# Patient Record
Sex: Female | Born: 1955 | Race: Black or African American | Hispanic: No | Marital: Single | Smoking: Never smoker
Health system: Southern US, Community
[De-identification: ages and names within clinical notes are randomized; demographics above are authoritative.]

## PROBLEM LIST (undated history)

## (undated) DIAGNOSIS — I10 Essential (primary) hypertension: Secondary | ICD-10-CM

---

## 2009-02-15 ENCOUNTER — Emergency Department (HOSPITAL_COMMUNITY): Admission: EM | Admit: 2009-02-15 | Discharge: 2009-02-15 | Payer: Self-pay | Admitting: Emergency Medicine

## 2019-03-28 ENCOUNTER — Encounter (HOSPITAL_COMMUNITY): Payer: Self-pay | Admitting: Emergency Medicine

## 2019-03-28 ENCOUNTER — Ambulatory Visit (HOSPITAL_COMMUNITY)
Admission: EM | Admit: 2019-03-28 | Discharge: 2019-03-28 | Disposition: A | Discharge status: Home / Self Care | Payer: PRIVATE HEALTH INSURANCE | Attending: Family Medicine | Admitting: Family Medicine

## 2019-03-28 DIAGNOSIS — T63441A Toxic effect of venom of bees, accidental (unintentional), initial encounter: Secondary | ICD-10-CM

## 2019-03-28 DIAGNOSIS — M79672 Pain in left foot: Secondary | ICD-10-CM | POA: Diagnosis not present

## 2019-03-28 HISTORY — DX: Essential (primary) hypertension: I10

## 2019-03-28 MED ORDER — TRAMADOL HCL 50 MG PO TABS: 50.0000 mg | ORAL_TABLET | Freq: Every evening | ORAL | 0 refills | Status: DC | PRN

## 2019-03-28 NOTE — ED Provider Notes (Signed)
[facility]    CSN: [id number] Arrival date & time: 03/28/19  1220     History   Chief Complaint Chief Complaint  Patient presents with  . Appointment    1230`  . Insect Bite    HPI Andrea Suarez is a 63 y.o. female.   63 year old female comes in for bee sting of the left ventral aspect of foot occurring last night.  States she was walking up and down the stairs with the door open, and stepped on the knee.  She has pain to the distal first and second MTP the left foot since.  Has throbbing, with painful range of motion and weightbearing.  Denies swelling, erythema, warmth.  She has been taking ibuprofen without much relief.  States having trouble sleeping at night due to the pain.  Denies itching, shortness of breath, swelling of the throat, tripoding, drooling, trismus.     Past Medical History:  Diagnosis Date  . Hypertension     There are no active problems to display for this patient.   History reviewed. No pertinent surgical history.  OB History   No obstetric history on file.      Home Medications    Prior to Admission medications   Medication Sig Start Date End Date Taking? Authorizing Provider  traMADol (ULTRAM) 50 MG tablet Take 1 tablet (50 mg total) by mouth at bedtime as needed. 03/28/19   Belinda Fisher, PA-C    Family History Family History  Family history unknown: Yes    Social History Social History   Tobacco Use  . Smoking status: Never Smoker  Substance Use Topics  . Alcohol use: Yes  . Drug use: Not on file     Allergies   Patient has no known allergies.   Review of Systems Review of Systems  Reason unable to perform ROS: See HPI as above.     Physical Exam Triage Vital Signs ED Triage Vitals [03/28/19 1241]  Enc Vitals Group     BP (!) 204/105     Pulse Rate 71     Resp 18     Temp 98.5 F (36.9 C)     Temp src      SpO2 96 %     Weight      Height      Head Circumference      Peak Flow      Pain Score  8     Pain Loc      Pain Edu?      Excl. in GC?    No data found.  Updated Vital Signs BP (!) 176/92   Pulse 71   Temp 98.5 F (36.9 C)   Resp 18   SpO2 96%   Physical Exam Constitutional:      General: She is not in acute distress.    Appearance: She is well-developed. She is not diaphoretic.  HENT:     Head: Normocephalic and atraumatic.  Eyes:     Conjunctiva/sclera: Conjunctivae normal.     Pupils: Pupils are equal, round, and reactive to light.  Musculoskeletal:     Comments: No swelling, erythema, warmth, contusion.  No obvious puncture wound.  Tenderness to palpation of distal left first and second MTP.  No tenderness to palpation of toes.  Full range of motion of toes and ankles.  Sensation intact and equal bilaterally.  Pedal pulse 2+, cap refill less than 2 seconds.  Neurological:     Mental Status: She  is alert and oriented to person, place, and time.     UC Treatments / Results  Labs (all labs ordered are listed, but only abnormal results are displayed) Labs Reviewed - No data to display  EKG None  Radiology No results found.  Procedures Procedures (including critical care time)  Medications Ordered in UC Medications - No data to display  Initial Impression / Assessment and Plan / UC Course  I have reviewed the triage vital signs and the nursing notes.  Pertinent labs & imaging results that were available during my care of the patient were reviewed by me and considered in my medical decision making (see chart for details).    No alarming signs on exam. Will have patient continue NSAIDs. Tramadol D#5 provided at night as needed to help with pain. Ice compress, rest. Return precautions given. Patient expresses understanding and agrees to plan.  Final Clinical Impressions(s) / UC Diagnoses   Final diagnoses:  Bee sting, accidental or unintentional, initial encounter    ED Prescriptions    Medication Sig Dispense Auth. Provider   traMADol  (ULTRAM) 50 MG tablet Take 1 tablet (50 mg total) by mouth at bedtime as needed. 5 tablet Belinda FisherYu, Keymiah Lyles V, PA-C     Controlled Substance Prescriptions [place] Controlled Substance Registry consulted? Yes, I have consulted the [place] Controlled Substances Registry for this patient, and feel the risk/benefit ratio today is favorable for proceeding with this prescription for a controlled substance.   Belinda FisherYu, Sya Nestler V, PA-C 03/28/19 1324

## 2019-03-28 NOTE — Discharge Instructions (Signed)
No alarming signs on exam. No infection seen at this time. Ibuprofen 600-800mg  three times a day for pain and inflammation. Tramadol as needed at night for further pain relief. Ice compress to the foot, with elevation. Monitor for spreading redness, increased warmth, fever, follow up for reevaluation needed.

## 2019-03-28 NOTE — ED Triage Notes (Signed)
Pt states last night she was stung by a wasp on her L foot.

## 2019-11-20 ENCOUNTER — Emergency Department (HOSPITAL_COMMUNITY)
Admission: EM | Admit: 2019-11-20 | Discharge: 2019-11-20 | Disposition: A | Discharge status: Home / Self Care | Payer: PRIVATE HEALTH INSURANCE | Attending: Emergency Medicine | Admitting: Emergency Medicine

## 2019-11-20 ENCOUNTER — Other Ambulatory Visit: Payer: Self-pay

## 2019-11-20 ENCOUNTER — Ambulatory Visit (HOSPITAL_COMMUNITY)
Admission: EM | Admit: 2019-11-20 | Discharge: 2019-11-20 | Disposition: A | Discharge status: Other Acute Inpatient Hospital | Payer: PRIVATE HEALTH INSURANCE | Source: Home / Self Care

## 2019-11-20 ENCOUNTER — Encounter (HOSPITAL_COMMUNITY): Payer: Self-pay | Admitting: Emergency Medicine

## 2019-11-20 ENCOUNTER — Encounter (HOSPITAL_COMMUNITY): Payer: Self-pay

## 2019-11-20 ENCOUNTER — Ambulatory Visit (HOSPITAL_COMMUNITY)
Admission: EM | Admit: 2019-11-20 | Discharge: 2019-11-20 | Disposition: A | Discharge status: Home / Self Care | Payer: PRIVATE HEALTH INSURANCE | Attending: Emergency Medicine | Admitting: Emergency Medicine

## 2019-11-20 DIAGNOSIS — Z23 Encounter for immunization: Secondary | ICD-10-CM

## 2019-11-20 DIAGNOSIS — Y9289 Other specified places as the place of occurrence of the external cause: Secondary | ICD-10-CM | POA: Insufficient documentation

## 2019-11-20 DIAGNOSIS — S61315A Laceration without foreign body of left ring finger with damage to nail, initial encounter: Secondary | ICD-10-CM

## 2019-11-20 DIAGNOSIS — Z5189 Encounter for other specified aftercare: Secondary | ICD-10-CM

## 2019-11-20 DIAGNOSIS — W260XXA Contact with knife, initial encounter: Secondary | ICD-10-CM

## 2019-11-20 DIAGNOSIS — I10 Essential (primary) hypertension: Secondary | ICD-10-CM | POA: Insufficient documentation

## 2019-11-20 DIAGNOSIS — Y93G1 Activity, food preparation and clean up: Secondary | ICD-10-CM | POA: Insufficient documentation

## 2019-11-20 DIAGNOSIS — Y999 Unspecified external cause status: Secondary | ICD-10-CM | POA: Insufficient documentation

## 2019-11-20 DIAGNOSIS — S61215A Laceration without foreign body of left ring finger without damage to nail, initial encounter: Secondary | ICD-10-CM | POA: Insufficient documentation

## 2019-11-20 MED ORDER — TETANUS-DIPHTH-ACELL PERTUSSIS 5-2.5-18.5 LF-MCG/0.5 IM SUSP
0.5000 mL | Freq: Once | INTRAMUSCULAR | Status: AC
  Administered 2019-11-20: 13:00:00 0.5 mL via INTRAMUSCULAR

## 2019-11-20 MED ORDER — TETANUS-DIPHTH-ACELL PERTUSSIS 5-2.5-18.5 LF-MCG/0.5 IM SUSP
INTRAMUSCULAR | Status: AC
  Filled 2019-11-20: qty 0.5

## 2019-11-20 MED ORDER — LIDOCAINE-EPINEPHRINE-TETRACAINE (LET) TOPICAL GEL
TOPICAL | Status: AC
  Filled 2019-11-20: qty 3

## 2019-11-20 MED ORDER — TRAMADOL HCL 50 MG PO TABS: 50.0000 mg | ORAL_TABLET | Freq: Four times a day (QID) | ORAL | 0 refills | Status: DC | PRN

## 2019-11-20 MED ORDER — CEPHALEXIN 500 MG PO CAPS: 500.0000 mg | ORAL_CAPSULE | Freq: Four times a day (QID) | ORAL | 0 refills | Status: AC

## 2019-11-20 MED ORDER — CEPHALEXIN 500 MG PO CAPS: 500.0000 mg | ORAL_CAPSULE | Freq: Four times a day (QID) | ORAL | 0 refills | Status: DC

## 2019-11-20 NOTE — ED Provider Notes (Signed)
[facility] Provider Note   CSN: [id number] Arrival date & time: 11/20/19  1753     History Chief Complaint  Patient presents with  . Extremity Laceration    Andrea Suarez is a 63 y.o. female with past medical history of hypertension presenting to emergency department today with chief complaint of finger laceration.  Patient states 2 days ago she was chopping sweet potatoes and accidentally sliced the pad of her left ring finger with a chef's knife.  Since then she has had persistent bleeding despite applying pressure.  She went to urgent care earlier today because the bleeding had not improved.  A procedure note shows patient had digital block, let was applied to the laceration and Dermabond was applied.  She states when she went home she was changing a trash bag and thinks she might of hit her finger on the edge of the trashcan causing it to bleed again.  She again tried to apply pressure without being able to stop bleeding.  She denies any associated pain, numbness or weakness, no decreased sensation..  She denies feeling lightheaded or dizzy.  She is not anticoagulated.  Tetanus was updated at urgent care and she was prescribed Keflex.    Past Medical History:  Diagnosis Date  . Hypertension     There are no problems to display for this patient.   History reviewed. No pertinent surgical history.   OB History   No obstetric history on file.     Family History  Family history unknown: Yes    Social History   Tobacco Use  . Smoking status: Never Smoker  Substance Use Topics  . Alcohol use: Yes  . Drug use: Not on file    Home Medications Prior to Admission medications   Medication Sig Start Date End Date Taking? Authorizing Provider  cephALEXin (KEFLEX) 500 MG capsule Take 1 capsule (500 mg total) by mouth 4 (four) times daily for 5 days. 11/20/19 11/25/19  Wieters, Hallie C, PA-C  traMADol (ULTRAM) 50 MG tablet Take 1 tablet  (50 mg total) by mouth every 6 (six) hours as needed. 11/20/19   Wieters, Hallie C, PA-C    Allergies    Patient has no known allergies.  Review of Systems   Review of Systems  All other systems are reviewed and are negative for acute change except as noted in the HPI.   Physical Exam Updated Vital Signs BP 117/83 (BP Location: Right Arm)   Pulse 94   Temp 98.7 F (37.1 C) (Oral)   Resp 16   SpO2 98%   Physical Exam Vitals and nursing note reviewed.  Constitutional:      Appearance: She is well-developed. She is not ill-appearing or toxic-appearing.  HENT:     Head: Normocephalic and atraumatic.     Nose: Nose normal.  Eyes:     General: No scleral icterus.       Right eye: No discharge.        Left eye: No discharge.     Conjunctiva/sclera: Conjunctivae normal.  Neck:     Vascular: No JVD.  Cardiovascular:     Rate and Rhythm: Normal rate and regular rhythm.     Pulses: Normal pulses.     Heart sounds: Normal heart sounds.  Pulmonary:     Effort: Pulmonary effort is normal.     Breath sounds: Normal breath sounds.  Abdominal:     General: There is no distension.  Musculoskeletal:  General: Normal range of motion.     Cervical back: Normal range of motion.     Comments: Left ring finger fingertip avulsion extending towards nail bed that has been repaired with Dermabond.  There is an extensive amount of Dermabond over tip of left finger.  There is small amount of blood oozing from under the dermabond. Radial pulse 2+ .  Full range of motion of left index finger.  Nailbed is intact. Able to wiggle all fingers.  Skin:    General: Skin is warm and dry.  Neurological:     Mental Status: She is oriented to person, place, and time.     GCS: GCS eye subscore is 4. GCS verbal subscore is 5. GCS motor subscore is 6.     Comments: Fluent speech, no facial droop.  Psychiatric:        Behavior: Behavior normal.       ED Results / Procedures / Treatments    Labs (all labs ordered are listed, but only abnormal results are displayed) Labs Reviewed - No data to display  EKG None  Radiology No results found.  Procedures Procedures (including critical care time)  Medications Ordered in ED Medications - No data to display  ED Course  I have reviewed the triage vital signs and the nursing notes.  Pertinent labs & imaging results that were available during my care of the patient were reviewed by me and considered in my medical decision making (see chart for details).    MDM Rules/Calculators/A&P                      Patient seen and examined.  She is well-appearing, no acute distress. VSS.  Left index finger is coated and Dermabond however there is still a small amount of blood oozing from underneath it.  Range of motion is intact of left finger and she is denying any associated pain.  Radial pulses 2+.  Applied wound seal powder and bleeding stopped. Applied pressure dressing and aluminum finger splint for protection.   The patient appears reasonably screened and/or stabilized for discharge and I doubt any other medical condition or other [facility] requiring further screening, evaluation, or treatment in the ED at this time prior to discharge. The patient is safe for discharge with strict return precautions discussed. Recommend pcp follow up for wound check in 2 days. Patient will continue antibiotic as prescribed by urgent care.  She agrees with plan of care.  Portions of this note were generated with Scientist, clinical (histocompatibility and immunogenetics). Dictation errors may occur despite best attempts at proofreading.    Final Clinical Impression(s) / ED Diagnoses Final diagnoses:  Visit for wound check    Rx / DC Orders ED Discharge Orders    None       Sherene Sires, PA-C 11/20/19 1946    Virgina Norfolk, DO 11/21/19 0022

## 2019-11-20 NOTE — Discharge Instructions (Addendum)
Keep area clean and dry, avoid further injury/trauma to this area temporarily Begin keflex 4 times daily for 5 days Tetanus updated  WOUND CARE  Keep area clean and dry for 24 hours. Do not remove bandage, if applied.  After 24 hours, remove bandage and wash wound gently with mild soap and warm water. Reapply a new bandage after cleaning wound, if directed.  Continue daily cleansing with soap and water until stitches/staples are removed.  Do not apply any ointments or creams to the wound while dermabond  in place, as this may cause delayed healing.  Notify the office if you experience any of the following signs of infection: Swelling, redness, pus drainage, streaking, fever >101.0 F  Notify the office if you experience excessive bleeding that does not stop after 15-20 minutes of constant, firm pressure.

## 2019-11-20 NOTE — ED Notes (Signed)
Patient verbalizes understanding of discharge instructions. Opportunity for questioning and answers were provided. Armband removed by staff, pt discharged from ED.  

## 2019-11-20 NOTE — ED Provider Notes (Signed)
[facility]    CSN: [id number] Arrival date & time: 11/20/19  1147      History   Chief Complaint Chief Complaint  Patient presents with  . Laceration    HPI Andrea Suarez is a 63 y.o. female history of hypertension presenting today for evaluation of finger laceration.  Patient states 2 days ago she accidentally cut the tip of her left ring finger with a chef's knife.  She was chopping sweet potatoes.  Denies using a mandolin.  The tip of her finger was sliced off.  Since she has had persistent bleeding.  Bleeding started to improve yesterday, but this morning she noticed it was still bleeding 1.5 days later.  She is unsure of her last tetanus.  Denies difficulty bending or moving finger.  Main concern is bleeding.  Denies use of anticoagulants.  HPI  Past Medical History:  Diagnosis Date  . Hypertension     There are no problems to display for this patient.   History reviewed. No pertinent surgical history.  OB History   No obstetric history on file.      Home Medications    Prior to Admission medications   Medication Sig Start Date End Date Taking? Authorizing Provider  cephALEXin (KEFLEX) 500 MG capsule Take 1 capsule (500 mg total) by mouth 4 (four) times daily for 5 days. 11/20/19 11/25/19  Fuquan Wilson C, PA-C  traMADol (ULTRAM) 50 MG tablet Take 1 tablet (50 mg total) by mouth every 6 (six) hours as needed. 11/20/19   Taquilla Downum, Junius Creamer, PA-C    Family History Family History  Family history unknown: Yes    Social History Social History   Tobacco Use  . Smoking status: Never Smoker  Substance Use Topics  . Alcohol use: Yes  . Drug use: Not on file     Allergies   Patient has no known allergies.   Review of Systems Review of Systems  Constitutional: Negative for fatigue and fever.  Eyes: Negative for visual disturbance.  Respiratory: Negative for shortness of breath.   Cardiovascular: Negative for chest pain.  Gastrointestinal:  Negative for abdominal pain, nausea and vomiting.  Musculoskeletal: Negative for arthralgias and joint swelling.  Skin: Positive for color change and wound. Negative for rash.  Neurological: Negative for dizziness, weakness, light-headedness and headaches.     Physical Exam Triage Vital Signs ED Triage Vitals  Enc Vitals Group     BP 11/20/19 1339 (!) 151/87     Pulse Rate 11/20/19 1339 70     Resp 11/20/19 1339 18     Temp 11/20/19 1339 98.5 F (36.9 C)     Temp Source 11/20/19 1339 Oral     SpO2 11/20/19 1339 94 %     Weight --      Height --      Head Circumference --      Peak Flow --      Pain Score 11/20/19 1206 8     Pain Loc --      Pain Edu? --      Excl. in GC? --    No data found.  Updated Vital Signs BP (!) 151/87 (BP Location: Left Arm)   Pulse 70   Temp 98.5 F (36.9 C) (Oral)   Resp 18   SpO2 94%   Visual Acuity Right Eye Distance:   Left Eye Distance:   Bilateral Distance:    Right Eye Near:   Left Eye Near:    Bilateral  Near:     Physical Exam Vitals and nursing note reviewed.  Constitutional:      Appearance: She is well-developed.     Comments: No acute distress  HENT:     Head: Normocephalic and atraumatic.     Nose: Nose normal.  Eyes:     Conjunctiva/sclera: Conjunctivae normal.  Cardiovascular:     Rate and Rhythm: Normal rate.  Pulmonary:     Effort: Pulmonary effort is normal. No respiratory distress.  Abdominal:     General: There is no distension.  Musculoskeletal:        General: Normal range of motion.     Cervical back: Neck supple.     Comments: Left ring finger: Fingertip avulsion extending towards nail bed, bleeding profusely; full active range of motion of the finger, radial pulse 2+  Skin:    General: Skin is warm and dry.  Neurological:     Mental Status: She is alert and oriented to person, place, and time.      UC Treatments / Results  Labs (all labs ordered are listed, but only abnormal results are  displayed) Labs Reviewed - No data to display  EKG   Radiology No results found.  Procedures Laceration Repair  Date/Time: 11/20/2019 2:09 PM Performed by: Elleah Hemsley, [place] C, PA-C Authorized by: Marry Kusch, Elesa Hacker, PA-C   Consent:    Consent obtained:  Verbal   Consent given by:  Patient   Risks discussed:  Pain, infection, poor cosmetic result and poor wound healing   Alternatives discussed:  Observation Anesthesia (see MAR for exact dosages):    Anesthesia method:  Nerve block and topical application   Topical anesthetic:  LET   Block location:  Base of left ring finger   Block needle gauge:  25 G   Block anesthetic:  Lidocaine 2% w/o epi   Block technique:  2 seperate injection sites   Block injection procedure:  Introduced needle and anatomic landmarks identified   Block outcome:  Anesthesia achieved Laceration details:    Location:  Finger   Finger location:  L ring finger   Length (cm):  1 Repair type:    Repair type:  Simple Pre-procedure details:    Preparation:  Patient was prepped and draped in usual sterile fashion Exploration:    Hemostasis achieved with:  LET, tourniquet and direct pressure   Wound exploration: wound explored through full range of motion     Wound extent: no foreign bodies/material noted and no underlying fracture noted   Skin repair:    Repair method:  Tissue adhesive Post-procedure details:    Dressing:  Non-adherent dressing   Patient tolerance of procedure:  Tolerated well, no immediate complications   (including critical care time)  Medications Ordered in UC Medications  Tdap (BOOSTRIX) injection 0.5 mL (0.5 mLs Intramuscular Given 11/20/19 1301)    Initial Impression / Assessment and Plan / UC Course  I have reviewed the triage vital signs and the nursing notes.  Pertinent labs & imaging results that were available during my care of the patient were reviewed by me and considered in my medical decision making (see chart for  details).     Hemostasis achieved after prolonged pressure, elevation.  Initially continuing to bleed, digital block initiated in order to perform cautery, but after digital block bleeding subsided.  Opted for application of Dermabond instead.  Wound not cleansed and irrigated as typically would due to initial concern to stop profuse bleeding, after hemostasis achieved deferred  further cleansing due to concern of reinitiating bleeding.  Placing on Keflex empirically because of this to take for the next 5 days.  Advised to monitor for any signs of infection and follow-up immediately if developing any redness, increased pain or drainage.  Discussed strict return precautions. Patient verbalized understanding and is agreeable with plan.  Final Clinical Impressions(s) / UC Diagnoses   Final diagnoses:  Laceration of left ring finger without foreign body with damage to nail, initial encounter     Discharge Instructions     Keep area clean and dry, avoid further injury/trauma to this area temporarily Begin keflex 4 times daily for 5 days Tetanus updated  WOUND CARE . Keep area clean and dry for 24 hours. Do not remove bandage, if applied. . After 24 hours, remove bandage and wash wound gently with mild soap and warm water. Reapply a new bandage after cleaning wound, if directed. . Continue daily cleansing with soap and water until stitches/staples are removed. . Do not apply any ointments or creams to the wound while dermabond  in place, as this may cause delayed healing. . Notify the office if you experience any of the following signs of infection: Swelling, redness, pus drainage, streaking, fever >101.0 F . Notify the office if you experience excessive bleeding that does not stop after 15-20 minutes of constant, firm pressure.   ED Prescriptions    Medication Sig Dispense Auth. Provider   cephALEXin (KEFLEX) 500 MG capsule  (Status: Discontinued) Take 1 capsule (500 mg total) by mouth 4  (four) times daily for 5 days. 20 capsule Emiline Mancebo C, PA-C   traMADol (ULTRAM) 50 MG tablet Take 1 tablet (50 mg total) by mouth every 6 (six) hours as needed. 10 tablet Yehudis Monceaux C, PA-C   cephALEXin (KEFLEX) 500 MG capsule Take 1 capsule (500 mg total) by mouth 4 (four) times daily for 5 days. 20 capsule Caitlynne Harbeck, [place] C, PA-C     I have reviewed the PDMP during this encounter.   Lew DawesWieters, Rickesha Veracruz C, PA-C 11/20/19 1414

## 2019-11-20 NOTE — Discharge Instructions (Addendum)
Leave dressing on for the next 24 hours.  The splint has been provided to protect your finger from hitting anything and causing it to bleed again.  Continue the antibiotic as prescribed by urgent care.  Recommend you follow-up in 2 days to have wound check as we discussed.   Return to the emergency department for any new or worsening symptoms including unable to control bleeding, weakness, dizziness, surrounding redness or streaking to suggest infection   ----Please follow up with primary care provider by scheduling an appointment as soon as possible for a visit  If you do not have a primary care physician, contact [facility] at [phone] for referral   ?

## 2019-11-20 NOTE — ED Notes (Signed)
Patient is being discharged from the Urgent [facility] and sent to the Emergency Department via personal vehicle by self. Per provider Debara Pickett, patient is stable but in need of higher level of care due to unable to stop bleeding . Patient is aware and verbalizes understanding of plan of care. There were no vitals filed for this visit.

## 2019-11-20 NOTE — ED Triage Notes (Signed)
Pt states she was seen at [facility] this morning for L 4th digit laceration and had Dermabond placed.  States area has opened back up.

## 2019-11-20 NOTE — ED Triage Notes (Signed)
Pt presents with chunk of tip of left ring finger slightly cut off after cutting up some vegetables Friday evening; pt still has significant amount of bleeding and pain to area.

## 2019-11-24 ENCOUNTER — Emergency Department (HOSPITAL_COMMUNITY): Payer: PRIVATE HEALTH INSURANCE

## 2019-11-24 ENCOUNTER — Emergency Department (HOSPITAL_COMMUNITY)
Admission: EM | Admit: 2019-11-24 | Discharge: 2019-11-24 | Disposition: A | Discharge status: Home / Self Care | Payer: PRIVATE HEALTH INSURANCE | Attending: Emergency Medicine | Admitting: Emergency Medicine

## 2019-11-24 ENCOUNTER — Encounter (HOSPITAL_COMMUNITY): Payer: Self-pay | Admitting: Emergency Medicine

## 2019-11-24 ENCOUNTER — Other Ambulatory Visit: Payer: Self-pay

## 2019-11-24 DIAGNOSIS — R52 Pain, unspecified: Secondary | ICD-10-CM

## 2019-11-24 DIAGNOSIS — I10 Essential (primary) hypertension: Secondary | ICD-10-CM | POA: Insufficient documentation

## 2019-11-24 DIAGNOSIS — S61215D Laceration without foreign body of left ring finger without damage to nail, subsequent encounter: Secondary | ICD-10-CM | POA: Insufficient documentation

## 2019-11-24 DIAGNOSIS — W260XXD Contact with knife, subsequent encounter: Secondary | ICD-10-CM | POA: Insufficient documentation

## 2019-11-24 DIAGNOSIS — Z5189 Encounter for other specified aftercare: Secondary | ICD-10-CM | POA: Insufficient documentation

## 2019-11-24 MED ORDER — LIDOCAINE HCL (PF) 1 % IJ SOLN
INTRAMUSCULAR | Status: AC
  Filled 2019-11-24: qty 10

## 2019-11-24 MED ORDER — TRAMADOL HCL 50 MG PO TABS: 50.0000 mg | ORAL_TABLET | Freq: Four times a day (QID) | ORAL | 0 refills | Status: AC | PRN

## 2019-11-24 NOTE — Discharge Instructions (Addendum)
Please abide by wound care directions, as described by Dr. Mardelle Matte.   Return to the ED for any other new or worsening symptoms, otherwise plan to see Dr. Mardelle Matte outpatient for ongoing evaluation and management.   Please be wary that the tramadol can make you drowsy. Do not combine with alcohol or operate any heavy machinery.

## 2019-11-24 NOTE — ED Triage Notes (Signed)
Pt. Stated, I was here on Sunday for a cut on my finger on Friday. It looks red and infected.  Finger is red and swollen. Warm to touch.

## 2019-11-24 NOTE — Consult Note (Signed)
Reason for Consult:Left ring finger pain Referring Physician: [name] is an 63 y.o. female.  HPI: Andrea Suarez comes in with continued pain and swelling in her left ring finger. She cut the tip of it off on Christmas. She had a hard time getting the bleeding to stop and kept a rubber band around it tight for Friday and Saturday. It seemed like it was improving but continued to bleed on Sunday and she went to UC. They treated with dermabond but she was still bleeding that evening and came to ED. They were finally able to get it to stop and also started abx. On Monday or Tuesday the middle of the finger began to swell and hurt and she returns today for evaluation. She is RHD.  Past Medical History:  Diagnosis Date  . Hypertension     History reviewed. No pertinent surgical history.  Family History  Family history unknown: Yes    Social History:  reports that she has never smoked. She has never used smokeless tobacco. She reports current alcohol use. No history on file for drug.  Allergies: No Known Allergies  Medications: I have reviewed the patient's current medications.  No results found for this or any previous visit (from the past 48 hour(s)).  DG Finger Ring Left  Result Date: 11/24/2019 CLINICAL DATA:  Wound infection for 4 days. EXAM: LEFT RING FINGER 2+V COMPARISON:  None. FINDINGS: Soft tissue swelling centered about the middle phalanx of the fourth digit. No radiopaque foreign object. No osseous destruction. No acute fracture or dislocation. There is more mild soft tissue swelling about the dorsal proximal interphalangeal joint. IMPRESSION: Soft tissue swelling, without acute osseous abnormality. Electronically Signed   By: Abigail Miyamoto M.D.   On: 11/24/2019 16:08    Review of Systems  Constitutional: Negative for chills, diaphoresis and fever.  HENT: Negative for ear discharge, ear pain, hearing loss and tinnitus.   Eyes: Negative for photophobia and pain.   Respiratory: Negative for cough and shortness of breath.   Cardiovascular: Negative for chest pain.  Gastrointestinal: Negative for abdominal pain, nausea and vomiting.  Genitourinary: Negative for dysuria, flank pain, frequency and urgency.  Musculoskeletal: Positive for arthralgias (Left ring finger). Negative for back pain, myalgias and neck pain.  Neurological: Negative for dizziness and headaches.  Hematological: Does not bruise/bleed easily.  Psychiatric/Behavioral: The patient is not nervous/anxious.    Blood pressure (!) 147/111, pulse 90, temperature 98.6 F (37 C), temperature source Oral, resp. rate 20, SpO2 97 %. Physical Exam  Constitutional: She appears well-developed and well-nourished. No distress.  HENT:  Head: Normocephalic and atraumatic.  Eyes: Conjunctivae are normal. Right eye exhibits no discharge. Left eye exhibits no discharge. No scleral icterus.  Cardiovascular: Normal rate and regular rhythm.  Respiratory: Effort normal. No respiratory distress.  Musculoskeletal:     Cervical back: Normal range of motion.     Comments: Left shoulder, elbow, wrist, digits- Healing tip amputation ring finger, superficial fluid collection P2 ~270 degrees volar, mod TTP, nontender, no instability, inability to flex finger 2/2 swelling  Sens  Ax/R/M/U intact, ring finger paresthetic radial/ulnar  Mot   Ax/ R/ PIN/ M/ AIN/ U intact  Rad 2+  Neurological: She is alert.  Skin: Skin is warm and dry. She is not diaphoretic.  Psychiatric: She has a normal mood and affect. Her behavior is normal.    Assessment/Plan: Left ring finger injury -- Unsure about this case. Superficial fluid collection ? From tourniquet use for  2d. Given degree of pain and inability to flex finger worried about infection/tenosynovitis. Will have Dr. Dion Saucier evaluate when he is out of OR later today.    Freeman Caldron, PA-C Orthopedic Surgery [phone] 11/24/2019, 4:25 PM

## 2019-11-24 NOTE — ED Notes (Signed)
Patient Alert and oriented to baseline. Stable and ambulatory to baseline. Patient verbalized understanding of the discharge instructions.  Patient belongings were taken by the patient.   

## 2019-11-24 NOTE — ED Provider Notes (Signed)
[facility] EMERGENCY DEPARTMENT Provider Note   CSN: [id number] Arrival date & time: 11/24/19  1233     History Chief Complaint  Patient presents with  . Wound Infection    Andrea Suarez is a 63 y.o. female with PMH significant for HTN who presents to the ED for a wound check.  Patient reportedly sliced off the pad of her left ring finger with a chef's knife on 24/26/8341 while slicing sweet potatoes.  She presented to the UC for persistent bleeding on 11/20/2019 and Dermabond was applied and she was provided prescription for Keflex.  She then returned to the ED shortly after returning home due to ongoing "oozing" and wound seal powder was applied and hemostasis was achieved.  She was also provided an aluminum finger splint for protection.  She was instructed to return to her PCP for wound check on 11/22/2019.  Patient reports that she has been taking her antibiotics as prescribed and has 1 day left.  She admits that she has not been elevating her hand to help prevent swelling.   HPI     Past Medical History:  Diagnosis Date  . Hypertension     There are no problems to display for this patient.   History reviewed. No pertinent surgical history.   OB History   No obstetric history on file.     Family History  Family history unknown: Yes    Social History   Tobacco Use  . Smoking status: Never Smoker  . Smokeless tobacco: Never Used  Substance Use Topics  . Alcohol use: Yes  . Drug use: Not on file    Home Medications Prior to Admission medications   Medication Sig Start Date End Date Taking? Authorizing Provider  cephALEXin (KEFLEX) 500 MG capsule Take 1 capsule (500 mg total) by mouth 4 (four) times daily for 5 days. 11/20/19 11/25/19  Wieters, Hallie C, PA-C  traMADol (ULTRAM) 50 MG tablet Take 1 tablet (50 mg total) by mouth every 6 (six) hours as needed. 11/24/19   Marchia Bond, MD    Allergies    Patient has no known  allergies.  Review of Systems   Review of Systems  Constitutional: Negative for chills and fever.  Musculoskeletal: Positive for arthralgias.  Skin: Positive for color change and wound.    Physical Exam Updated Vital Signs BP (!) 147/111 (BP Location: Right Arm)   Pulse 90   Temp 98.6 F (37 C) (Oral)   Resp 20   SpO2 97%   Physical Exam Vitals and nursing note reviewed. Exam conducted with a chaperone present.  Constitutional:      Appearance: Normal appearance. She is not ill-appearing.  HENT:     Head: Normocephalic and atraumatic.  Eyes:     General: No scleral icterus.    Conjunctiva/sclera: Conjunctivae normal.  Cardiovascular:     Rate and Rhythm: Normal rate and regular rhythm.     Pulses: Normal pulses.     Heart sounds: Normal heart sounds.  Pulmonary:     Effort: Pulmonary effort is normal.     Breath sounds: Normal breath sounds.  Musculoskeletal:     Cervical back: Normal range of motion and neck supple.     Comments: Left index finger: Circumferential blister noted immediately distal to PIP joint.  Hemostasis achieved. Dermabond and wound seal powder occludes nail. MCP joint undisturbed. Patient can flex and extend affected digit against resistance. Limited ROM at PIP and DIP unable to flex or extend,  likely due to swelling. Sensation intact throughout. Erythema all the way up to the PIP joint, most notably in blister. Left hand: No extension of infection into dorsum or palmar aspect of hand. Wrist ROM and strength fully intact.   Skin:    General: Skin is dry.  Neurological:     Mental Status: She is alert and oriented to person, place, and time.     GCS: GCS eye subscore is 4. GCS verbal subscore is 5. GCS motor subscore is 6.  Psychiatric:        Mood and Affect: Mood normal.        Behavior: Behavior normal.        Thought Content: Thought content normal.            ED Results / Procedures / Treatments   Labs (all labs ordered are listed,  but only abnormal results are displayed) Labs Reviewed - No data to display  EKG None  Radiology DG Finger Ring Left  Result Date: 11/24/2019 CLINICAL DATA:  Wound infection for 4 days. EXAM: LEFT RING FINGER 2+V COMPARISON:  None. FINDINGS: Soft tissue swelling centered about the middle phalanx of the fourth digit. No radiopaque foreign object. No osseous destruction. No acute fracture or dislocation. There is more mild soft tissue swelling about the dorsal proximal interphalangeal joint. IMPRESSION: Soft tissue swelling, without acute osseous abnormality. Electronically Signed   By: Jeronimo Greaves M.D.   On: 11/24/2019 16:08    Procedures Procedures (including critical care time)  Medications Ordered in ED Medications  lidocaine (PF) (XYLOCAINE) 1 % injection (has no administration in time range)    ED Course  I have reviewed the triage vital signs and the nursing notes.  Pertinent labs & imaging results that were available during my care of the patient were reviewed by me and considered in my medical decision making (see chart for details).    MDM Rules/Calculators/A&P                      Patient appears to have onychomycosis on some of the unaffected fingers.  We will have Earney Hamburg PA-C evaluate patient given her swollen, erythematous affected digit after sustaining laceration 6 days ago.  DG finger ring left demonstrates no acute bony abnormalities or gas patterns concerning for osteomyelitis.   Earney Hamburg, PA-C will have Dr. Dion Saucier, on-call hand specialist, evaluate the patient.  Spoke with Dr. Dion Saucier after he evaluated the patient and he will do a bedside procedure to remove Dermabond as well as debride and clean the affected area.  He also plans to have her follow-up outpatient.   Bedside procedure went well. Dr. Dion Saucier will see her on Monday outpatient. He also called her in some tramadol. Patient safe for discharge at this time.     Final Clinical  Impression(s) / ED Diagnoses Final diagnoses:  Encounter for post-traumatic wound check    Rx / DC Orders ED Discharge Orders         Ordered    traMADol (ULTRAM) 50 MG tablet  Every 6 hours PRN     11/24/19 1833           Lorelee New, PA-C 11/24/19 1844    Gwyneth Sprout, MD 11/25/19 1950

## 2019-12-16 ENCOUNTER — Ambulatory Visit: Payer: Self-pay | Attending: Internal Medicine

## 2019-12-16 DIAGNOSIS — Z23 Encounter for immunization: Secondary | ICD-10-CM | POA: Insufficient documentation

## 2019-12-19 NOTE — Progress Notes (Signed)
   Covid-19 Vaccination Clinic  Name:  Galina Haddox    MRN: [id number] DOB: Dec 02, 1955  12/16/2019  Ms. Elbe was observed post Covid-19 immunization for 15 minutes without incidence. She was provided with Vaccine Information Sheet and instruction to access the V-Safe system.   Ms. Pickler was instructed to call 911 with any severe reactions post vaccine: Marland Kitchen Difficulty breathing  . Swelling of your face and throat  . A fast heartbeat  . A bad rash all over your body  . Dizziness and weakness    Immunizations Administered    Name Date Dose VIS Date Route   Moderna COVID-19 Vaccine 12/16/2019 10:59 AM 0.5 mL 10/25/2019 Intramuscular   Manufacturer: Moderna   Lot: [id number]   NDC: 30148-403-97

## 2020-01-13 ENCOUNTER — Ambulatory Visit: Payer: Self-pay

## 2020-01-20 ENCOUNTER — Ambulatory Visit: Payer: Self-pay | Attending: Internal Medicine

## 2020-01-20 DIAGNOSIS — Z23 Encounter for immunization: Secondary | ICD-10-CM | POA: Insufficient documentation

## 2020-01-20 NOTE — Progress Notes (Signed)
   Covid-19 Vaccination Clinic  Name:  Andrea Suarez    MRN: [id number] DOB: [birth year]  01/20/2020  [name] was observed post Covid-19 immunization for 15 minutes without incidence. She was provided with Vaccine Information Sheet and instruction to access the V-Safe system.   [name] was instructed to call 911 with any severe reactions post vaccine: Marland Kitchen Difficulty breathing  . Swelling of your face and throat  . A fast heartbeat  . A bad rash all over your body  . Dizziness and weakness    Immunizations Administered    Name Date Dose VIS Date Route   Moderna COVID-19 Vaccine 01/20/2020 12:02 PM 0.5 mL 10/25/2019 Intramuscular   Manufacturer: Moderna   Lot: [id number]   NDC: 25500-164-29

## 2020-09-24 DIAGNOSIS — H60332 Swimmer's ear, left ear: Secondary | ICD-10-CM | POA: Diagnosis not present

## 2020-10-22 DIAGNOSIS — H60332 Swimmer's ear, left ear: Secondary | ICD-10-CM | POA: Diagnosis not present

## 2021-11-11 IMAGING — CR DG FINGER RING 2+V*L*
3 series · 3 of 3 positions shown · non-contrast
Comparison: None.

CLINICAL DATA: Wound infection for 4 days.

EXAM:
LEFT RING FINGER 2+V

[finger ap]
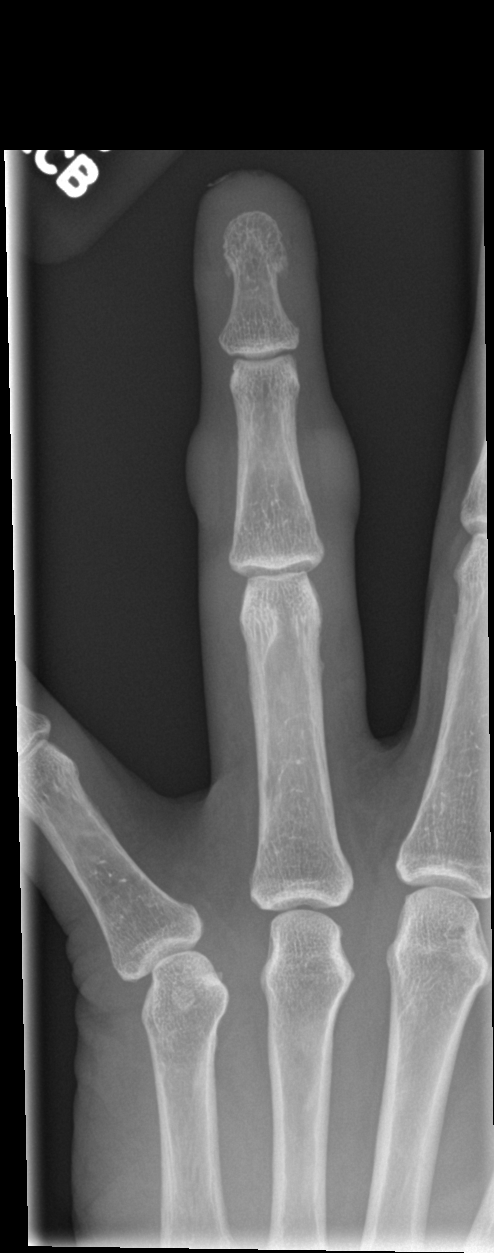

[finger obl]
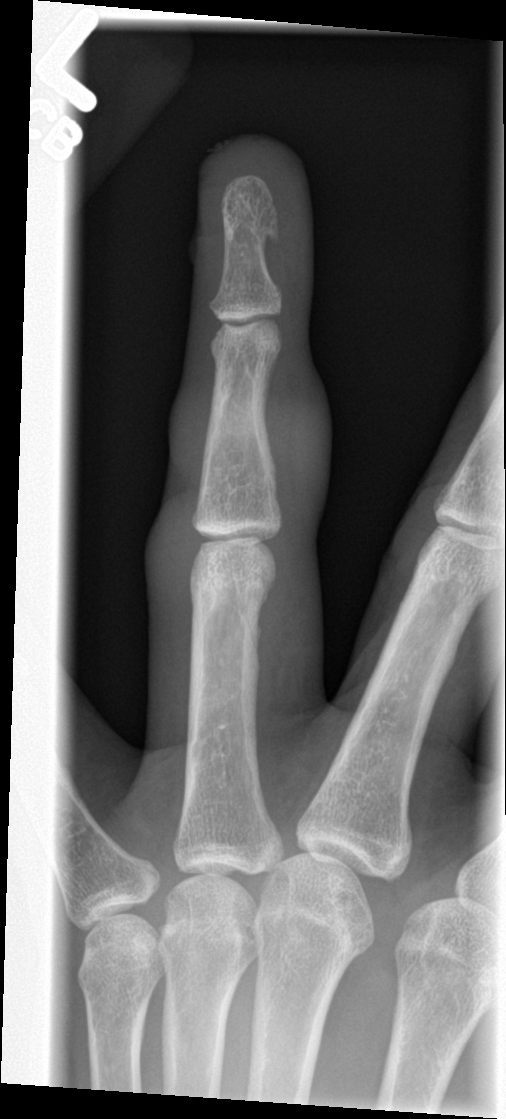

[finger lat]
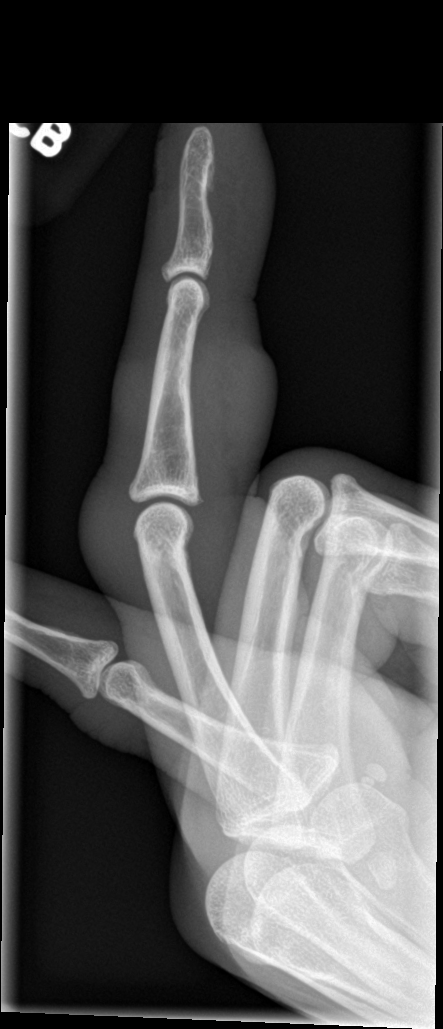

[3 of 3 positions shown; findings below may reference images not displayed]

FINDINGS: Soft tissue swelling centered about the middle phalanx of the fourth
digit. No radiopaque foreign object. No osseous destruction. No
acute fracture or dislocation. There is more mild soft tissue
swelling about the dorsal proximal interphalangeal joint.
IMPRESSION: Soft tissue swelling, without acute osseous abnormality.
